# Patient Record
Sex: Male | Born: 1995 | Race: White | Hispanic: No | Marital: Single | Smoking: Never smoker
Health system: Southern US, Community
[De-identification: ages and names within clinical notes are randomized; demographics above are authoritative.]

## PROBLEM LIST (undated history)

## (undated) DIAGNOSIS — F909 Attention-deficit hyperactivity disorder, unspecified type: Secondary | ICD-10-CM

---
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
## 2014-05-04 ENCOUNTER — Encounter (HOSPITAL_COMMUNITY): Payer: Self-pay | Admitting: Emergency Medicine

## 2014-05-04 ENCOUNTER — Emergency Department (HOSPITAL_COMMUNITY)
Admission: EM | Admit: 2014-05-04 | Discharge: 2014-05-04 | Disposition: A | Discharge status: Home / Self Care | Payer: BLUE CROSS/BLUE SHIELD | Attending: Emergency Medicine | Admitting: Emergency Medicine

## 2014-05-04 DIAGNOSIS — Z880 Allergy status to penicillin: Secondary | ICD-10-CM | POA: Insufficient documentation

## 2014-05-04 DIAGNOSIS — N451 Epididymitis: Secondary | ICD-10-CM | POA: Diagnosis not present

## 2014-05-04 DIAGNOSIS — N508 Other specified disorders of male genital organs: Secondary | ICD-10-CM | POA: Diagnosis present

## 2014-05-04 DIAGNOSIS — Z8659 Personal history of other mental and behavioral disorders: Secondary | ICD-10-CM | POA: Diagnosis not present

## 2014-05-04 LAB — URINALYSIS, ROUTINE W REFLEX MICROSCOPIC
BILIRUBIN URINE: NEGATIVE
GLUCOSE, UA: NEGATIVE mg/dL
HGB URINE DIPSTICK: NEGATIVE
Ketones, ur: NEGATIVE mg/dL
Leukocytes, UA: NEGATIVE
Nitrite: NEGATIVE
PROTEIN: NEGATIVE mg/dL
Specific Gravity, Urine: 1.031 — ABNORMAL HIGH (ref 1.005–1.030)
UROBILINOGEN UA: 1 mg/dL (ref 0.0–1.0)
pH: 6 (ref 5.0–8.0)

## 2014-05-04 MED ORDER — DOXYCYCLINE HYCLATE 100 MG PO CAPS: 100.0000 mg | ORAL_CAPSULE | Freq: Two times a day (BID) | ORAL | Status: AC

## 2014-05-04 MED ORDER — CEFTRIAXONE SODIUM 250 MG IJ SOLR
250.0000 mg | Freq: Once | INTRAMUSCULAR | Status: AC
  Administered 2014-05-04: 250 mg via INTRAMUSCULAR
  Filled 2014-05-04: qty 250

## 2014-05-04 MED ORDER — LIDOCAINE HCL 1 % IJ SOLN
INTRAMUSCULAR | Status: AC
  Administered 2014-05-04: 2.1 mL
  Filled 2014-05-04: qty 20

## 2014-05-04 MED ORDER — DOXYCYCLINE HYCLATE 100 MG PO TABS
100.0000 mg | ORAL_TABLET | Freq: Once | ORAL | Status: AC
  Administered 2014-05-04: 100 mg via ORAL
  Filled 2014-05-04: qty 1

## 2014-05-04 NOTE — Discharge Instructions (Signed)
Epididymitis °Epididymitis is a swelling (inflammation) of the epididymis. The epididymis is a cord-like structure along the back part of the testicle. Epididymitis is usually, but not always, caused by infection. This is usually a sudden problem beginning with chills, fever and pain behind the scrotum and in the testicle. There may be swelling and redness of the testicle. °DIAGNOSIS  °Physical examination will reveal a tender, swollen epididymis. Sometimes, cultures are obtained from the urine or from prostate secretions to help find out if there is an infection or if the cause is a different problem. Sometimes, blood work is performed to see if your white blood cell count is elevated and if a germ (bacterial) or viral infection is present. Using this knowledge, an appropriate medicine which kills germs (antibiotic) can be chosen by your caregiver. A viral infection causing epididymitis will most often go away (resolve) without treatment. °HOME CARE INSTRUCTIONS  °· Hot sitz baths for 20 minutes, 4 times per day, may help relieve pain. °· Only take over-the-counter or prescription medicines for pain, discomfort or fever as directed by your caregiver. °· Take all medicines, including antibiotics, as directed. Take the antibiotics for the full prescribed length of time even if you are feeling better. °· It is very important to keep all follow-up appointments. °SEEK IMMEDIATE MEDICAL CARE IF:  °· You have a fever. °· You have pain not relieved with medicines. °· You have any worsening of your problems. °· Your pain seems to come and go. °· You develop pain, redness, and swelling in the scrotum and surrounding areas. °MAKE SURE YOU:  °· Understand these instructions. °· Will watch your condition. °· Will get help right away if you are not doing well or get worse. °Document Released: 02/23/2000 Document Revised: 05/20/2011 Document Reviewed: 01/12/2009 °[employer]® Patient Information ©2015 [employer]. This information  is not intended to replace advice given to you by your health care provider. Make sure you discuss any questions you have with your health care provider. ° °

## 2014-05-04 NOTE — ED Provider Notes (Signed)
CSN: [id number]     Arrival date & time 05/04/14  1805 History   First MD Initiated Contact with Patient 05/04/14 1813     Chief Complaint  Patient presents with  . Testicle Pain  . lower abdominal pain      (Consider location/radiation/quality/duration/timing/severity/associated sxs/prior Treatment) Patient is a 19 y.o. male presenting with testicular pain. The history is provided by the patient.  Testicle Pain This is a new problem. Episode onset: 3 days ago. The problem occurs daily. The problem has not changed since onset.Associated symptoms comments: Intermittent pain in tingling that will also give him a strange feeling in his abdomen but no consistent pain. No fever, vomiting, diarrhea. No penile discharge. No dysuria. Nothing aggravates the symptoms. Nothing relieves the symptoms. He has tried nothing for the symptoms. The treatment provided no relief.    Past Medical History  Diagnosis Date  . ADHD (attention deficit hyperactivity disorder)    History reviewed. No pertinent past surgical history. No family history on file. History  Substance Use Topics  . Smoking status: Never Smoker   . Smokeless tobacco: Not on file  . Alcohol Use: No    Review of Systems  Genitourinary: Positive for testicular pain.  All other systems reviewed and are negative.     Allergies  Apple; Pecan pollen; and Penicillins  Home Medications   Prior to Admission medications   Medication Sig Start Date End Date Taking? Authorizing Provider  Chlorpheniramine Maleate (ALLERGY PO) Take 1 tablet by mouth daily as needed (allergies).   Yes Historical Provider, MD   BP 137/71 mmHg  Pulse 91  Temp(Src) 98.3 F (36.8 C) (Oral)  Resp 16  SpO2 97% Physical Exam  Constitutional: He is oriented to person, place, and time. He appears well-developed and well-nourished. No distress.  HENT:  Head: Normocephalic and atraumatic.  Eyes: Pupils are equal, round, and reactive to light.   Cardiovascular: Normal rate.   Pulmonary/Chest: Effort normal.  Abdominal: Soft. Bowel sounds are normal. He exhibits no distension. There is no tenderness. There is no rebound and no guarding.  Genitourinary: Penis normal. Left testis shows no mass, no swelling and no tenderness. Circumcised.  Mild tenderness with palpation along the right epididymis without swelling. No fluctuance, induration or erythema  Musculoskeletal: Normal range of motion.  Neurological: He is alert and oriented to person, place, and time.  Skin: Skin is warm and dry. No rash noted. No erythema.  Psychiatric: He has a normal mood and affect. His behavior is normal.  Nursing note and vitals reviewed.   ED Course  Procedures (including critical care time) Labs Review Labs Reviewed  URINALYSIS, ROUTINE W REFLEX MICROSCOPIC - Abnormal; Notable for the following:    Specific Gravity, Urine 1.031 (*)    All other components within normal limits    Imaging Review No results found.   EKG Interpretation None      MDM   Final diagnoses:  Epididymitis    Patient presenting with intermittent right testicle Pain over the last 3 days. He is sexually active with 2 partners states he uses protection most of the time. No strenuous activity or concern for torsion at this time. He has mild tender less along his epididymis but without significant swelling or erythema. Most likely early epididymitis. UA pending. He denies any penile discharge.  11:41 PM UA wnl.  Pt treated for epididymitis with Rocephin and oral doxycycline   Gwyneth SproutWhitney Hazael Olveda, MD 05/05/14 2341
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
## 2014-05-13 ENCOUNTER — Emergency Department (HOSPITAL_COMMUNITY)
Admission: EM | Admit: 2014-05-13 | Discharge: 2014-05-13 | Disposition: A | Discharge status: Home / Self Care | Payer: BLUE CROSS/BLUE SHIELD | Attending: Emergency Medicine | Admitting: Emergency Medicine

## 2014-05-13 ENCOUNTER — Emergency Department (HOSPITAL_COMMUNITY): Payer: BLUE CROSS/BLUE SHIELD

## 2014-05-13 ENCOUNTER — Encounter (HOSPITAL_COMMUNITY): Payer: Self-pay

## 2014-05-13 DIAGNOSIS — N508 Other specified disorders of male genital organs: Secondary | ICD-10-CM | POA: Insufficient documentation

## 2014-05-13 DIAGNOSIS — Z79899 Other long term (current) drug therapy: Secondary | ICD-10-CM | POA: Insufficient documentation

## 2014-05-13 DIAGNOSIS — N50819 Testicular pain, unspecified: Secondary | ICD-10-CM

## 2014-05-13 DIAGNOSIS — Z792 Long term (current) use of antibiotics: Secondary | ICD-10-CM | POA: Diagnosis not present

## 2014-05-13 DIAGNOSIS — Z8659 Personal history of other mental and behavioral disorders: Secondary | ICD-10-CM | POA: Insufficient documentation

## 2014-05-13 DIAGNOSIS — Z880 Allergy status to penicillin: Secondary | ICD-10-CM | POA: Diagnosis not present

## 2014-05-13 DIAGNOSIS — N5082 Scrotal pain: Secondary | ICD-10-CM

## 2014-05-13 MED ORDER — TRAMADOL HCL 50 MG PO TABS: 50.0000 mg | ORAL_TABLET | Freq: Four times a day (QID) | ORAL | Status: AC | PRN

## 2014-05-13 NOTE — ED Notes (Signed)
Patient reports he was seen last week and diagnosed with epididymitis.  He says he has finished antibiotics but doesn't feel any better.  Reports increased urination as well.

## 2014-05-13 NOTE — Discharge Instructions (Signed)
CALL THE UROLOGIST AND MAKE AN APPOINTMENT FOR FURTHER EVALUATION OF SCROTAL AND TESTICULAR PAIN.

## 2014-05-13 NOTE — ED Provider Notes (Signed)
CSN: [id number]     Arrival date & time 05/13/14  0204 History   First MD Initiated Contact with Patient 05/13/14 [phone]     Chief Complaint  Patient presents with  . Testicle Pain     (Consider location/radiation/quality/duration/timing/severity/associated sxs/prior Treatment) Patient is a 19 y.o. male presenting with testicular pain. The history is provided by the patient. No language interpreter was used.  Testicle Pain Associated symptoms include abdominal pain. Pertinent negatives include no chills or fever. Associated symptoms comments: He was seen on 05/04/14 for right testicular pain and diagnosed with epididymitis. He has completed the antibiotics and returns to the ED tonight with complaint of worsening pain, urinary frequency without dysuria/hematuria and extension of discomfort into the lower abdomen. No fever. .    Past Medical History  Diagnosis Date  . ADHD (attention deficit hyperactivity disorder)    History reviewed. No pertinent past surgical history. History reviewed. No pertinent family history. History  Substance Use Topics  . Smoking status: Never Smoker   . Smokeless tobacco: Not on file  . Alcohol Use: No    Review of Systems  Constitutional: Negative for fever and chills.  Gastrointestinal: Positive for abdominal pain.  Genitourinary: Positive for frequency and testicular pain.      Allergies  Apple; Pecan pollen; and Penicillins  Home Medications   Prior to Admission medications   Medication Sig Start Date End Date Taking? Authorizing Provider  acetaminophen (TYLENOL) 500 MG tablet Take 500 mg by mouth every 6 (six) hours as needed for mild pain.   Yes Historical Provider, MD  albuterol (PROVENTIL HFA;VENTOLIN HFA) 108 (90 BASE) MCG/ACT inhaler Inhale 1-2 puffs into the lungs every 6 (six) hours as needed for wheezing or shortness of breath.   Yes Historical Provider, MD  doxycycline (VIBRAMYCIN) 100 MG capsule Take 1 capsule (100 mg total) by mouth  2 (two) times daily. Patient not taking: Reported on 05/13/2014 05/04/14   Gwyneth SproutWhitney Plunkett, MD   BP 137/91 mmHg  Pulse 63  Temp(Src) 97.8 F (36.6 C) (Oral)  Resp 18  Ht 5\' 8"  (1.727 m)  Wt 138 lb (62.596 kg)  BMI 20.99 kg/m2  SpO2 100% Physical Exam  Constitutional: He is oriented to person, place, and time. He appears well-developed and well-nourished. No distress.  Abdominal: Soft.  Mild lower abdominal tenderness to lower abdomen.   Genitourinary:  No scrotal swelling. Right testicular tenderness without palpable mass.   Neurological: He is alert and oriented to person, place, and time.  Skin: Skin is warm and dry.    ED Course  Procedures (including critical care time) Labs Review Labs Reviewed - No data to display  Imaging Review No results found.   EKG Interpretation None      MDM   Final diagnoses:  None    1. Scrotal pain  Refer to urology and encourage recheck. VSS. Feel he is appropriate for discharge home.     Arnoldo HookerShari A Laiana Fratus, PA-C 05/13/14 [phone]  Ward GivensIva L Knapp, MD 05/13/14 [phone]
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
